# Patient Record
Sex: Female | Born: 2000 | Race: Black or African American | Hispanic: No | Marital: Single | Smoking: Never smoker
Health system: Southern US, Community
[De-identification: ages and names within clinical notes are randomized; demographics above are authoritative.]

---
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
## 2019-04-06 ENCOUNTER — Other Ambulatory Visit: Payer: Self-pay

## 2019-04-06 ENCOUNTER — Ambulatory Visit: Payer: Self-pay | Admitting: *Deleted

## 2019-04-06 DIAGNOSIS — Z20822 Contact with and (suspected) exposure to covid-19: Secondary | ICD-10-CM

## 2019-04-06 NOTE — Telephone Encounter (Signed)
Pt want call back to discuss how the covid test is suppose to be done. She had it done at [facility] and doesn't think it was done correctly   Patient states her test was done at community test site and her test person was not as aggressive as her bosses- she was wondering if the test was done correctly. Told patient there are 2 types of tests- nasal swab and nasal/pharangeal. They could have had different tests- verses the agressiveness of the person collecting the sample. Questions answered to satisfaction.  Reason for Disposition . Health Information question, no triage required and triager able to answer question  Protocols used: INFORMATION ONLY CALL-A-AH

## 2019-04-07 LAB — NOVEL CORONAVIRUS, NAA: SARS-CoV-2, NAA: NOT DETECTED

## 2019-04-12 ENCOUNTER — Telehealth: Payer: Self-pay | Admitting: General Practice

## 2019-04-12 NOTE — Telephone Encounter (Signed)
Pt called and received covid results
